# Patient Record
Sex: Female | Born: 2000 | Race: Black or African American | Hispanic: No | Marital: Single
Health system: Southern US, Community
[De-identification: ages and names within clinical notes are randomized; demographics above are authoritative.]

---

## 2020-06-10 ENCOUNTER — Emergency Department (HOSPITAL_COMMUNITY): Payer: PRIVATE HEALTH INSURANCE

## 2020-06-10 ENCOUNTER — Emergency Department (HOSPITAL_COMMUNITY)
Admission: EM | Admit: 2020-06-10 | Discharge: 2020-06-10 | Disposition: A | Discharge status: Home / Self Care | Payer: PRIVATE HEALTH INSURANCE | Attending: Emergency Medicine | Admitting: Emergency Medicine

## 2020-06-10 ENCOUNTER — Other Ambulatory Visit: Payer: Self-pay

## 2020-06-10 DIAGNOSIS — K219 Gastro-esophageal reflux disease without esophagitis: Secondary | ICD-10-CM | POA: Insufficient documentation

## 2020-06-10 DIAGNOSIS — Z91010 Allergy to peanuts: Secondary | ICD-10-CM | POA: Diagnosis not present

## 2020-06-10 DIAGNOSIS — R079 Chest pain, unspecified: Secondary | ICD-10-CM | POA: Diagnosis present

## 2020-06-10 DIAGNOSIS — R0789 Other chest pain: Secondary | ICD-10-CM

## 2020-06-10 LAB — BASIC METABOLIC PANEL
Anion gap: 11 (ref 5–15)
BUN: 9 mg/dL (ref 6–20)
CO2: 22 mmol/L (ref 22–32)
Calcium: 9 mg/dL (ref 8.9–10.3)
Chloride: 103 mmol/L (ref 98–111)
Creatinine, Ser: 0.99 mg/dL (ref 0.44–1.00)
GFR, Estimated: >60 mL/min (ref >60)
Glucose, Bld: 92 mg/dL (ref 70–99)
Potassium: 3.6 mmol/L (ref 3.5–5.1)
Sodium: 136 mmol/L (ref 135–145)

## 2020-06-10 LAB — CBC WITH DIFFERENTIAL/PLATELET
Abs Immature Granulocytes: 0.02 10*3/uL (ref 0.00–0.07)
Basophils Absolute: 0 10*3/uL (ref 0.0–0.1)
Basophils Relative: 0 %
Eosinophils Absolute: 0.2 10*3/uL (ref 0.0–0.5)
Eosinophils Relative: 2 %
HCT: 38.1 % (ref 36.0–46.0)
Hemoglobin: 11.1 g/dL — ABNORMAL LOW (ref 12.0–15.0)
Immature Granulocytes: 0 %
Lymphocytes Relative: 28 %
Lymphs Abs: 2.5 10*3/uL (ref 0.7–4.0)
MCH: 23.1 pg — ABNORMAL LOW (ref 26.0–34.0)
MCHC: 29.1 g/dL — ABNORMAL LOW (ref 30.0–36.0)
MCV: 79.4 fL — ABNORMAL LOW (ref 80.0–100.0)
Monocytes Absolute: 0.5 10*3/uL (ref 0.1–1.0)
Monocytes Relative: 6 %
Neutro Abs: 5.8 10*3/uL (ref 1.7–7.7)
Neutrophils Relative %: 64 %
Platelets: 400 10*3/uL (ref 150–400)
RBC: 4.8 MIL/uL (ref 3.87–5.11)
RDW: 18 % — ABNORMAL HIGH (ref 11.5–15.5)
WBC: 9.1 10*3/uL (ref 4.0–10.5)
nRBC: 0 % (ref 0.0–0.2)

## 2020-06-10 LAB — TROPONIN I (HIGH SENSITIVITY)
Troponin I (High Sensitivity): <2 ng/L (ref <18)
Troponin I (High Sensitivity): 3 ng/L (ref <18)

## 2020-06-10 NOTE — ED Provider Notes (Signed)
Emergency Medicine Provider Triage Evaluation Note  Karlyn Glasco, a 20 y.o. female evaluated in triage.  Pt complains of chest pain, intermittent since last night. Also an episode or numbness to left arm which has resolved, feels more aching now.  BP 121/80 (BP Location: Right Arm)   Pulse 86   Temp 99.4 F (37.4 C) (Oral)   Resp 18   SpO2 99%   Patient is alert, no acute distress, normal work of breathing    Medically screening exam initiated at 4:29 PM. Appropriate orders placed.  Nabila Albarracin was informed that the remainder of the evaluation will be completed by another provider, this initial triage assessment does not replace that evaluation, and the importance of remaining in the ED until their evaluation is complete.       Alexya Mcdaris, [place] N, PA-C 06/10/20 1631    Gwyneth Sprout, MD 06/14/20 [id number]

## 2020-06-10 NOTE — ED Provider Notes (Addendum)
[facility] EMERGENCY DEPARTMENT Provider Note   CSN: [id number] Arrival date & time: 06/10/20  1601     History Chief Complaint  Patient presents with  . Chest Pain  . Numbness    Nichole Mclaughlin is a 20 y.o. female.  Chest Pain Immediately after dinner, patient started to have pain in the center and right side of her chest States that at the same time, she started to have numbness in her tongue, no difficulty breathing This went away, then started to have numbness feeling that came and went in her LUE and LLE No weakness, leg swelling, headaches, changes in vision, weakness This AM, continued to have some pain off and on, mostly when she was moving her upper body, so decided to come to the ED  States that she is currently taking her finals at college and is stressed because of this        No past medical history on file.  There are no problems to display for this patient.     OB History   No obstetric history on file.     No family history on file.     Home Medications Prior to Admission medications   Medication Sig Start Date End Date Taking? Authorizing Provider  ibuprofen (ADVIL) 200 MG tablet Take 200 mg by mouth every 6 (six) hours as needed for headache.   Yes [provider]  loratadine (CLARITIN) 10 MG tablet Take 10 mg by mouth daily as needed for allergies.   Yes [provider]    Allergies    Peanut-containing drug products and Fish allergy  Review of Systems   Review of Systems  Constitutional: Negative for activity change, appetite change, chills, fatigue and fever.  HENT: Negative for congestion, sore throat and trouble swallowing.   Eyes: Negative for visual disturbance.  Respiratory: Negative for cough and shortness of breath.   Cardiovascular: Positive for chest pain. Negative for palpitations and leg swelling.  Gastrointestinal: Negative for abdominal pain, diarrhea, nausea and vomiting.   Genitourinary: Negative for difficulty urinating, dysuria and hematuria.  Musculoskeletal: Negative for back pain, gait problem and myalgias.  Skin: Negative for rash.  Neurological: Positive for numbness. Negative for dizziness, syncope, weakness and headaches.    Physical Exam Updated Vital Signs BP 120/78 (BP Location: Right Arm)   Pulse 79   Temp 98.7 F (37.1 C) (Oral)   Resp 14   SpO2 100%   Physical Exam Vitals reviewed.  Constitutional:      General: She is not in acute distress.    Appearance: She is well-developed. She is not ill-appearing, toxic-appearing or diaphoretic.  HENT:     Head: Normocephalic and atraumatic.  Eyes:     Extraocular Movements: Extraocular movements intact.     Pupils: Pupils are equal, round, and reactive to light.  Cardiovascular:     Rate and Rhythm: Normal rate and regular rhythm.     Pulses:          Radial pulses are 2+ on the right side and 2+ on the left side.       Dorsalis pedis pulses are 2+ on the right side and 2+ on the left side.     Heart sounds: Normal heart sounds.   No systolic murmur is present. No friction rub. No gallop.   Pulmonary:     Effort: Pulmonary effort is normal. No respiratory distress.     Breath sounds: Normal breath sounds. No decreased breath sounds,  wheezing, rhonchi or rales.  Chest:     Chest wall: Tenderness (right rib 4 costochondral junction) present.  Abdominal:     Palpations: Abdomen is soft.     Tenderness: There is no abdominal tenderness. There is no guarding or rebound.  Musculoskeletal:     Cervical back: Normal range of motion and neck supple.     Right lower leg: No tenderness. No edema.     Left lower leg: No tenderness. No edema.     Comments: 5/5 strength BUE/BLE  Skin:    General: Skin is warm and dry.     Capillary Refill: Capillary refill takes less than 2 seconds.     Coloration: Skin is not pale.     Findings: No erythema.  Neurological:     General: No focal deficit  present.     Mental Status: She is alert and oriented to person, place, and time.     Cranial Nerves: No cranial nerve deficit.     Motor: No weakness.     Comments: Sensation intact throughout  Psychiatric:        Mood and Affect: Mood normal.        Behavior: Behavior normal.     ED Results / Procedures / Treatments   Labs (all labs ordered are listed, but only abnormal results are displayed) Labs Reviewed  CBC WITH DIFFERENTIAL/PLATELET - Abnormal; Notable for the following components:      Result Value   Hemoglobin 11.1 (*)    MCV 79.4 (*)    MCH 23.1 (*)    MCHC 29.1 (*)    RDW 18.0 (*)    All other components within normal limits  BASIC METABOLIC PANEL  TROPONIN I (HIGH SENSITIVITY)  TROPONIN I (HIGH SENSITIVITY)    EKG EKG Interpretation  Date/Time:  Thursday June 10 2020 16:24:59 EDT Ventricular Rate:  93 PR Interval:  150 QRS Duration: 74 QT Interval:  354 QTC Calculation: 440 R Axis:   59 Text Interpretation: Normal sinus rhythm Normal ECG No previous tracing Confirmed by Gwyneth Sprout ([id number]) on 06/10/2020 9:56:29 PM   Radiology DG Chest 2 View  Result Date: 06/10/2020 CLINICAL DATA:  Intermittent chest pain for the past day. EXAM: CHEST - 2 VIEW COMPARISON:  None. FINDINGS: The heart size and mediastinal contours are within normal limits. Normal pulmonary vascularity. Minimal linear atelectasis in the lingula and right lower lobe. No focal consolidation, pleural effusion, or pneumothorax. No acute osseous abnormality. IMPRESSION: 1. No acute cardiopulmonary disease. Electronically Signed   By: Obie Dredge M.D.   On: 06/10/2020 17:07    Procedures Procedures   Medications Ordered in ED Medications - No data to display  ED Course  I have reviewed the triage vital signs and the nursing notes.  Pertinent labs & imaging results that were available during my care of the patient were reviewed by me and considered in my medical decision making (see  chart for details).    MDM Rules/Calculators/A&P                          Patient is a 20 year old previously healthy female who presents with intermittent chest pain that started last night and transient intermittent numbness that was in her tongue, left upper extremity, left lower extremity.  This is resolved since last night.  She reports that pain started immediately after eating and then numbness feeling started.  She states that she was very anxious when the  chest pain occurred.  Her vital signs are stable, lab work in ED is unremarkable aside from mild microcytic anemia to 11.1.  Troponins negative x2.  EKG without acute changes.  Chest x-ray also without acute changes.  Very unlikely dissection, pulses symmetric and strong BUE, BLE and no radiation to back, pain also intermittent and reproducible to palpation.  She has tenderness to palpation of her chest wall, likely consistent with costochondritis.  Spoke with patient and her mother over FaceTime and advised that troponins have come back normal and reassuring.  Did discuss mild anemia with them, advised to take over-the-counter iron supplementation given that patient is menstruating.  Also advised that she can take ibuprofen 600 mg every 8 hours for the next 2 to 3 days scheduled, then can take as needed for costochondritis.  Also discussed that GERD could also be contributing, discussed lifestyle modifications and trying prilosec or pepcid over the counter. Discussed that it is very unlikely that patient has had TIA and very reassured that she is neurologically intact on examination.  Discussed return precautions including return of the symptoms, worsening of symptoms, focal weakness.  They voiced understanding.  Well score of 0.  Patient was discharged home in stable condition.   Final Clinical Impression(s) / ED Diagnoses Final diagnoses:  Chest wall pain  Gastroesophageal reflux disease, unspecified whether esophagitis present    Rx / DC  Orders ED Discharge Orders    None       Unknown Jim, DO 06/10/20 2145    Gwyneth Sprout, MD 06/10/20 2155    Obie Dredge, Solmon Ice, DO 06/10/20 2157    Gwyneth Sprout, MD 06/14/20 0740

## 2020-06-10 NOTE — Discharge Instructions (Addendum)
As we discussed, your blood work, imaging, EKG were all very reassuring.  It does look like you have a slight anemia, which could be from periods.  You can take over-the-counter iron supplementation for this and follow-up with your regular doctor.  Your pain is most consistent with costochondritis, you should take ibuprofen 600 mg every 8 hours for the next 2 to 3 days to help calm down this inflammation.  You can take it as needed after this.  You may also have reflux, for which you should take pepcid or prlosec over the counter, avoid spicy or acidic foods, sit upright at least 30 minutes after eating.  If the tingling comes back and does not improve, especially if it occurs with weakness, you should be seen right away.  It is possible that this tingling was from anxiety related to your chest pain and stress with school.  If testing comes back and does not go away, it is not tender to palpation, you have associated shortness of breath, you should be seen right away.

## 2020-06-10 NOTE — ED Triage Notes (Addendum)
Pt reports intermittent left arm numbness and chest pain starting yesterday.  NIH=0

## 2020-06-10 NOTE — ED Notes (Signed)
Medications follow up appts reviewed w/ pt. Denies questions or concerns @ this time. Education on s/s of worsening and when to return. Educated on taking motrin for next few days as well as tx of acid reflux. Symptoms resolved on own, workup WNL. Left w/ even and steady gait. NAD noted. VSS.

## 2021-09-08 IMAGING — CR DG CHEST 2V
2 series · 2 of 2 positions shown · non-contrast
Comparison: None.

CLINICAL DATA: Intermittent chest pain for the past day.

EXAM:
CHEST - 2 VIEW

[chest pa]
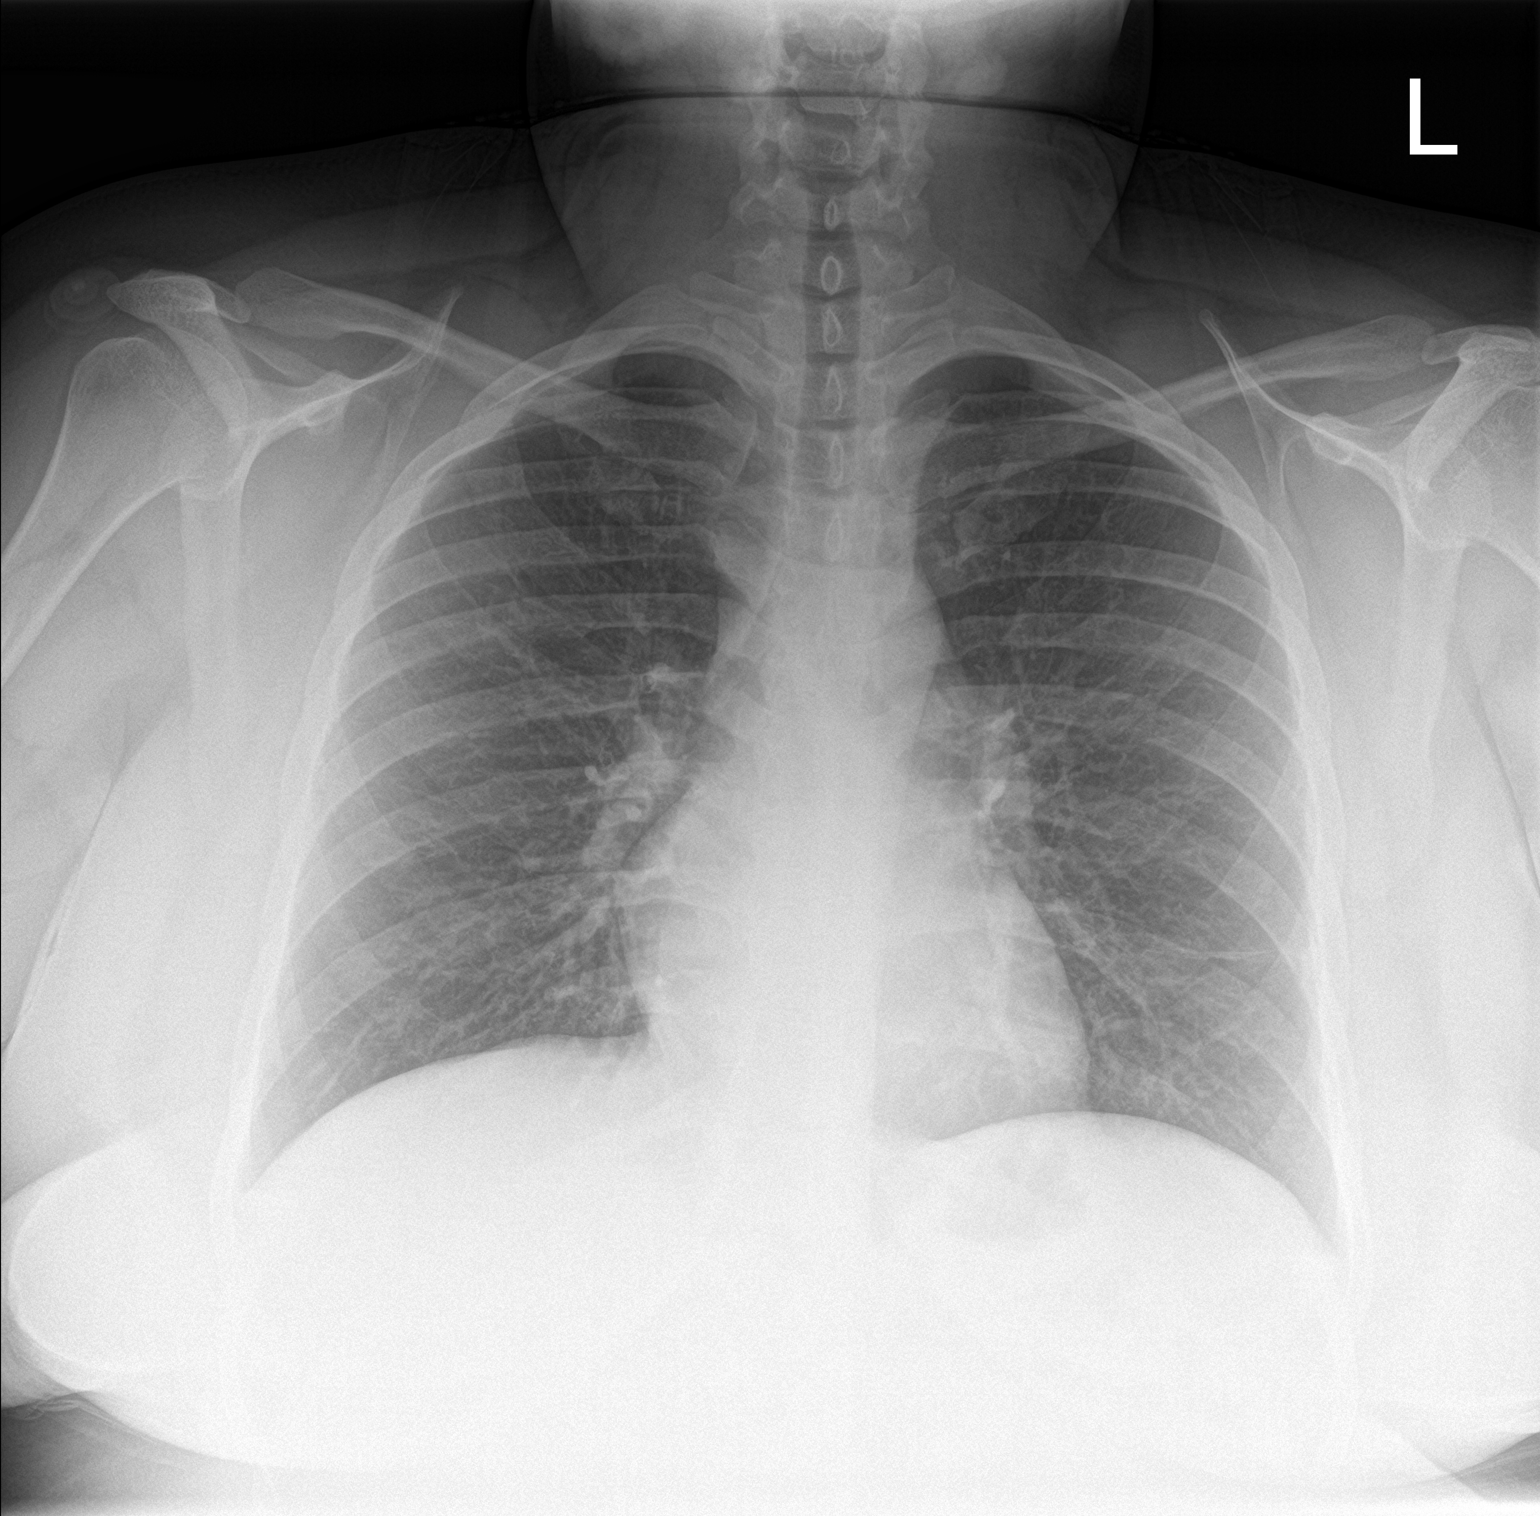

[chest lat]
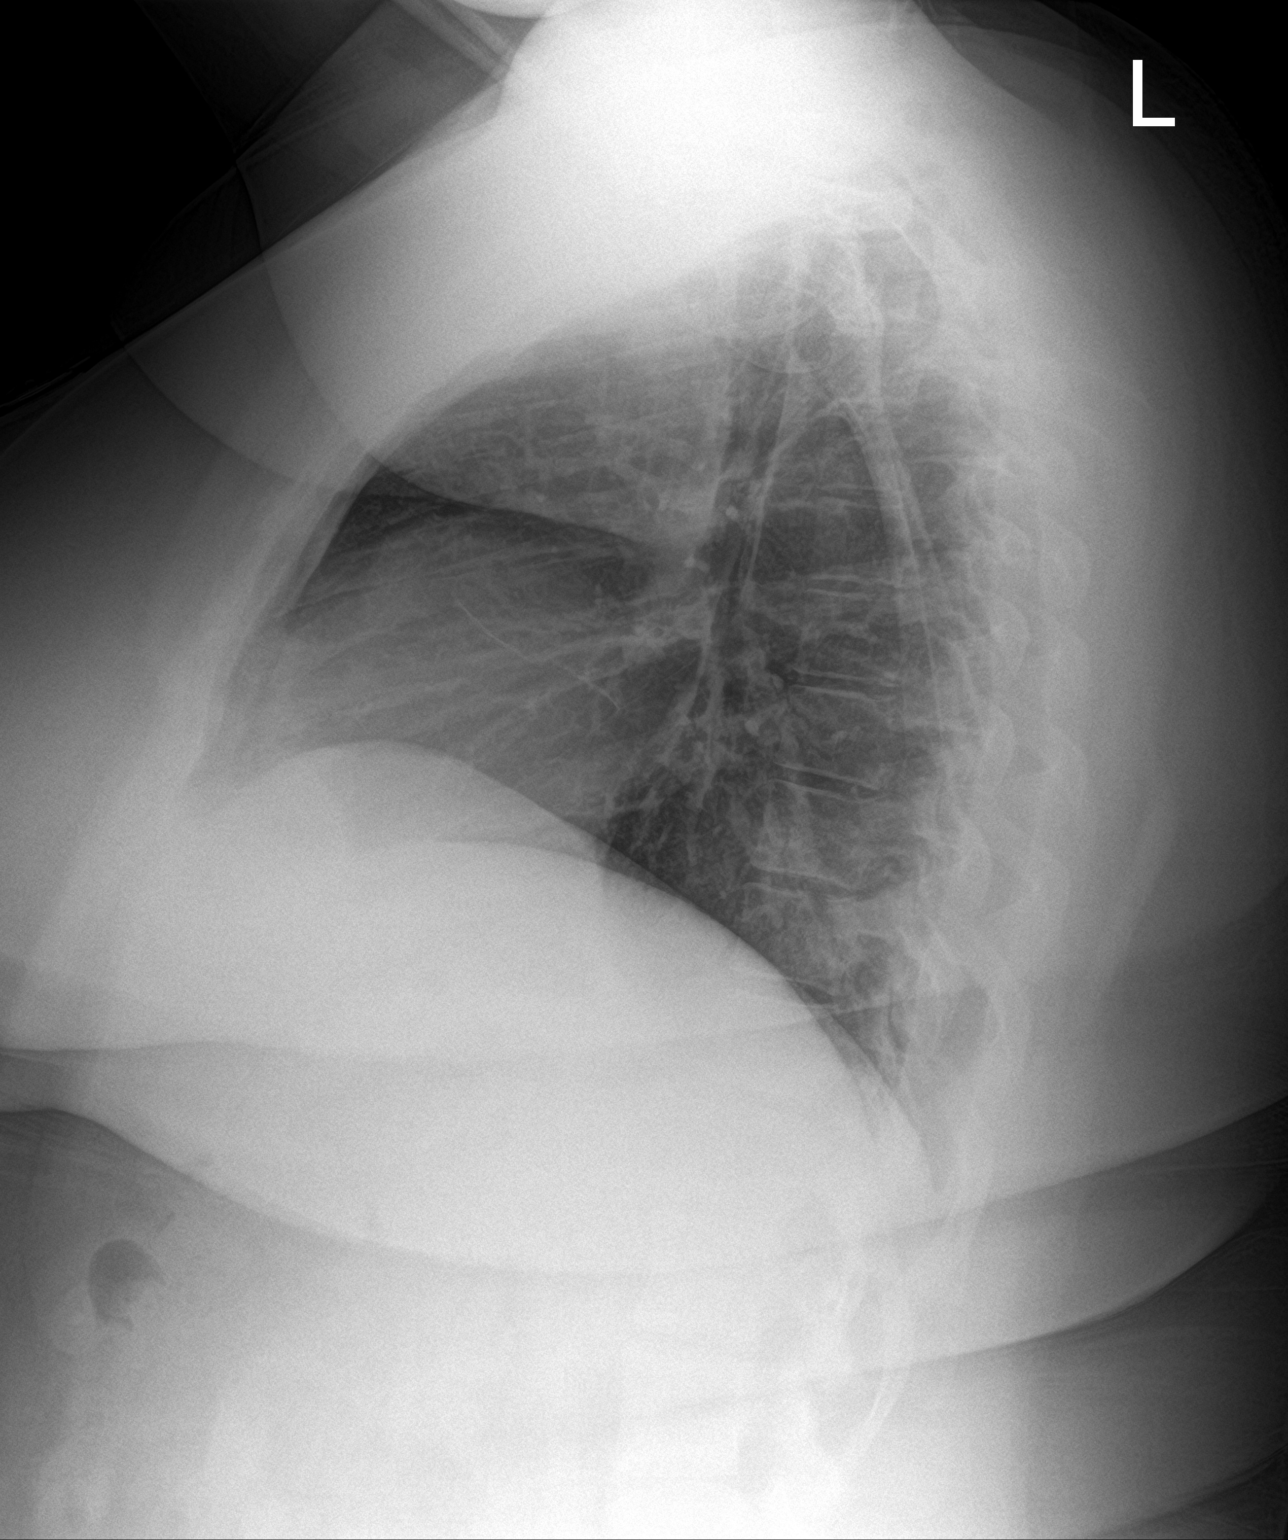

[2 of 2 positions shown; findings below may reference images not displayed]

FINDINGS: The heart size and mediastinal contours are within normal limits.
Normal pulmonary vascularity. Minimal linear atelectasis in the
lingula and right lower lobe. No focal consolidation, pleural
effusion, or pneumothorax. No acute osseous abnormality.
IMPRESSION: 1. No acute cardiopulmonary disease.
